# Patient Record
Sex: Male | Born: 1960 | Race: Black or African American | Hispanic: No | Marital: Single
Health system: Southern US, Community
[De-identification: ages and names within clinical notes are randomized; demographics above are authoritative.]

---

## 1999-06-16 ENCOUNTER — Emergency Department (HOSPITAL_COMMUNITY): Admission: EM | Admit: 1999-06-16 | Discharge: 1999-06-16 | Payer: Self-pay

## 1999-06-16 ENCOUNTER — Encounter: Payer: Self-pay | Admitting: Emergency Medicine

## 1999-11-30 ENCOUNTER — Encounter: Payer: Self-pay | Admitting: Emergency Medicine

## 1999-11-30 ENCOUNTER — Emergency Department (HOSPITAL_COMMUNITY): Admission: EM | Admit: 1999-11-30 | Discharge: 1999-11-30 | Payer: Self-pay | Admitting: Emergency Medicine

## 2005-04-22 ENCOUNTER — Emergency Department (HOSPITAL_COMMUNITY): Admission: EM | Admit: 2005-04-22 | Discharge: 2005-04-22 | Payer: Self-pay | Admitting: Emergency Medicine

## 2010-06-13 ENCOUNTER — Emergency Department (HOSPITAL_COMMUNITY): Admission: EM | Admit: 2010-06-13 | Discharge: 2010-06-13 | Payer: Self-pay | Admitting: Emergency Medicine

## 2012-04-11 IMAGING — CR DG HAND COMPLETE 3+V*L*
4 series · 4 of 4 positions shown · non-contrast
Comparison: None.

CLINICAL DATA: Laceration, trauma

LEFT HAND - COMPLETE 3+ VIEW

[x hand pa left]
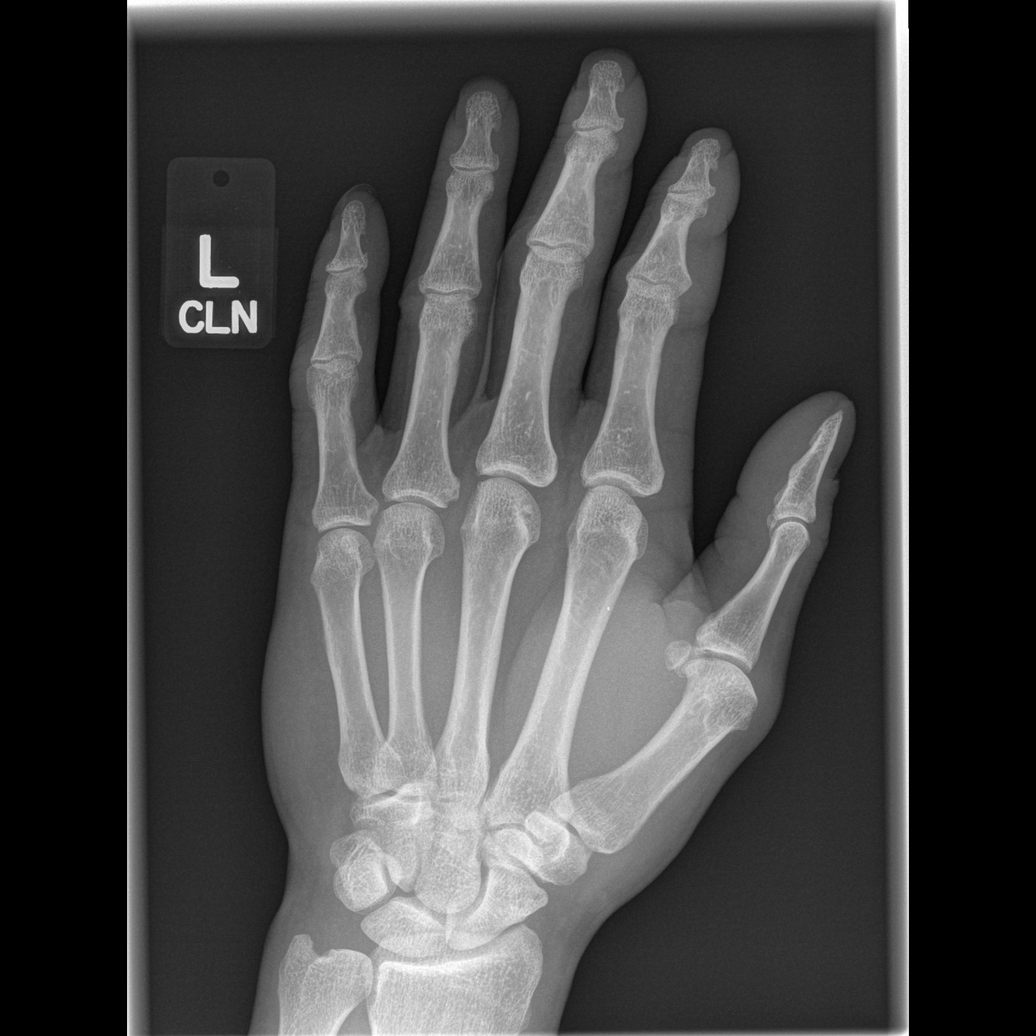

[x hand oblique left]
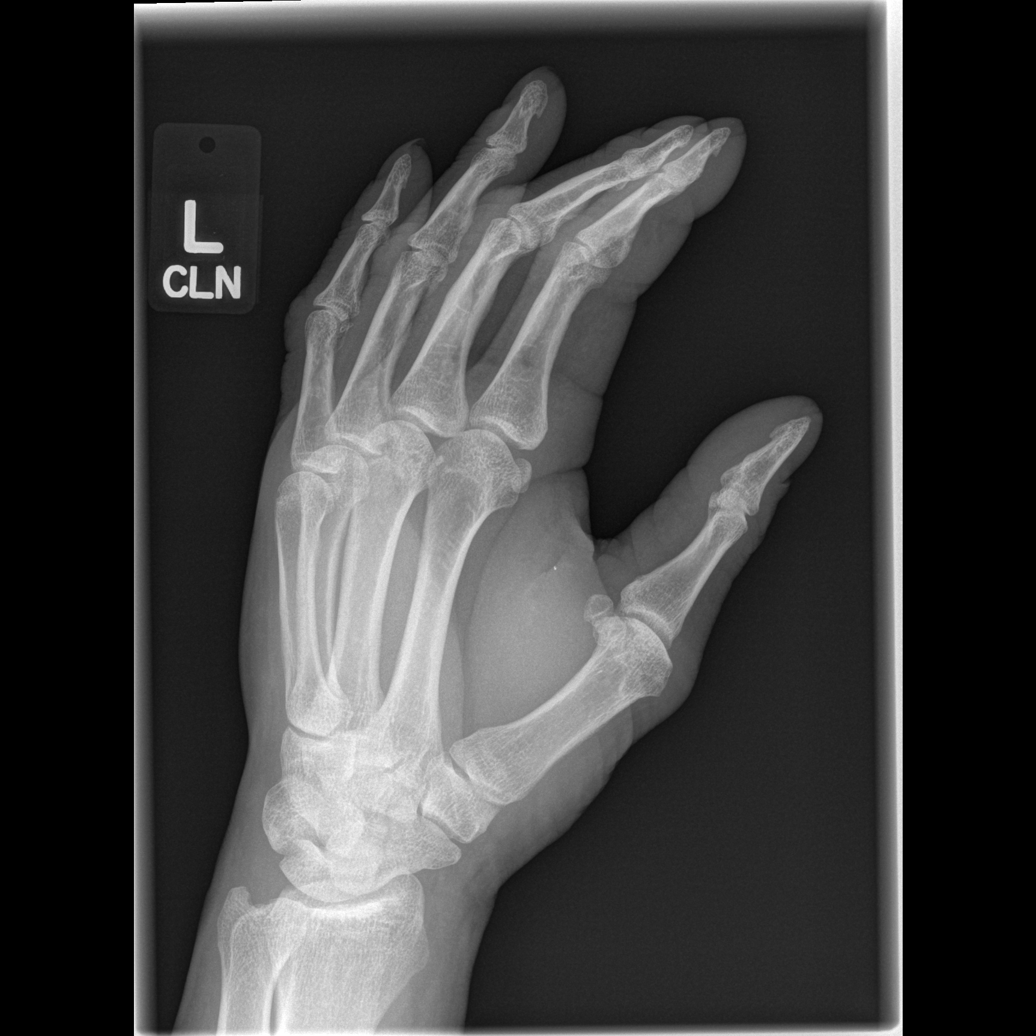

[x hand lat left (1 of 2)]
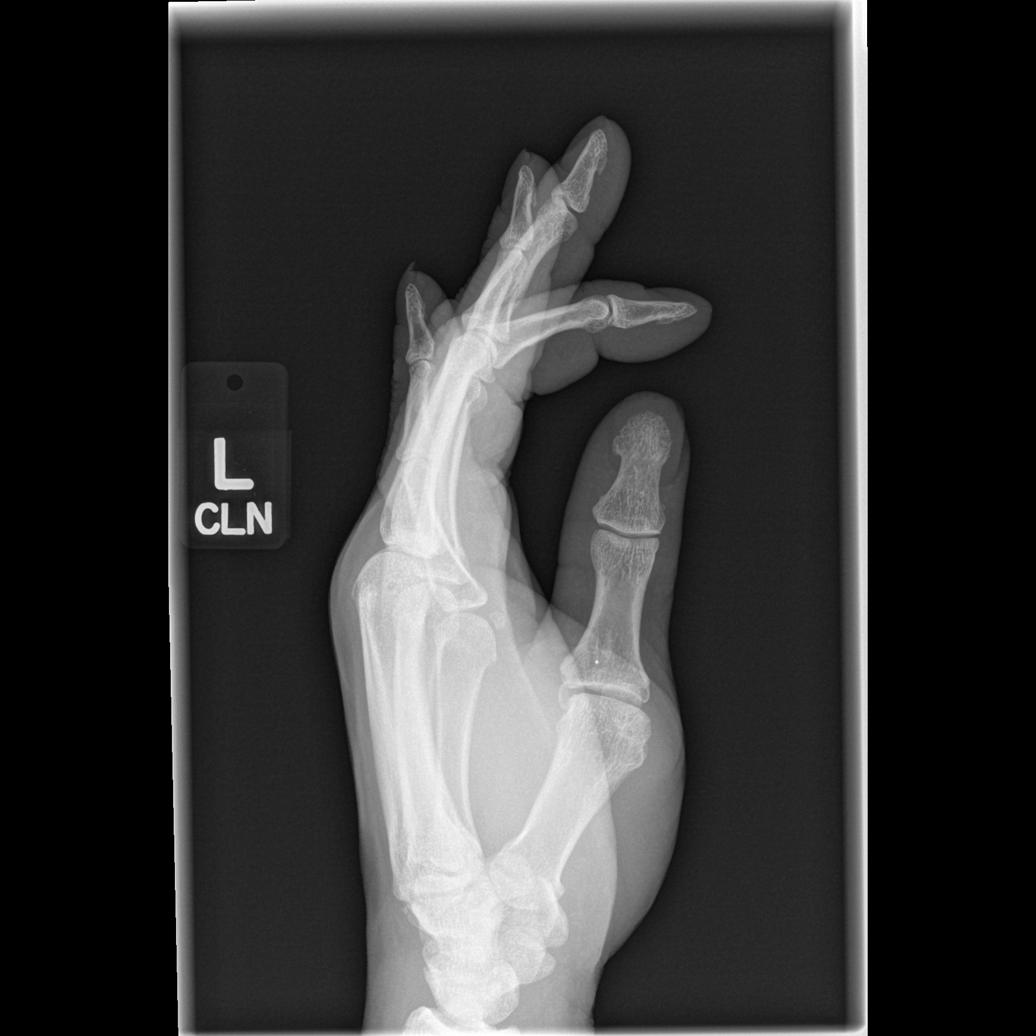

[x hand lat left (2 of 2)]
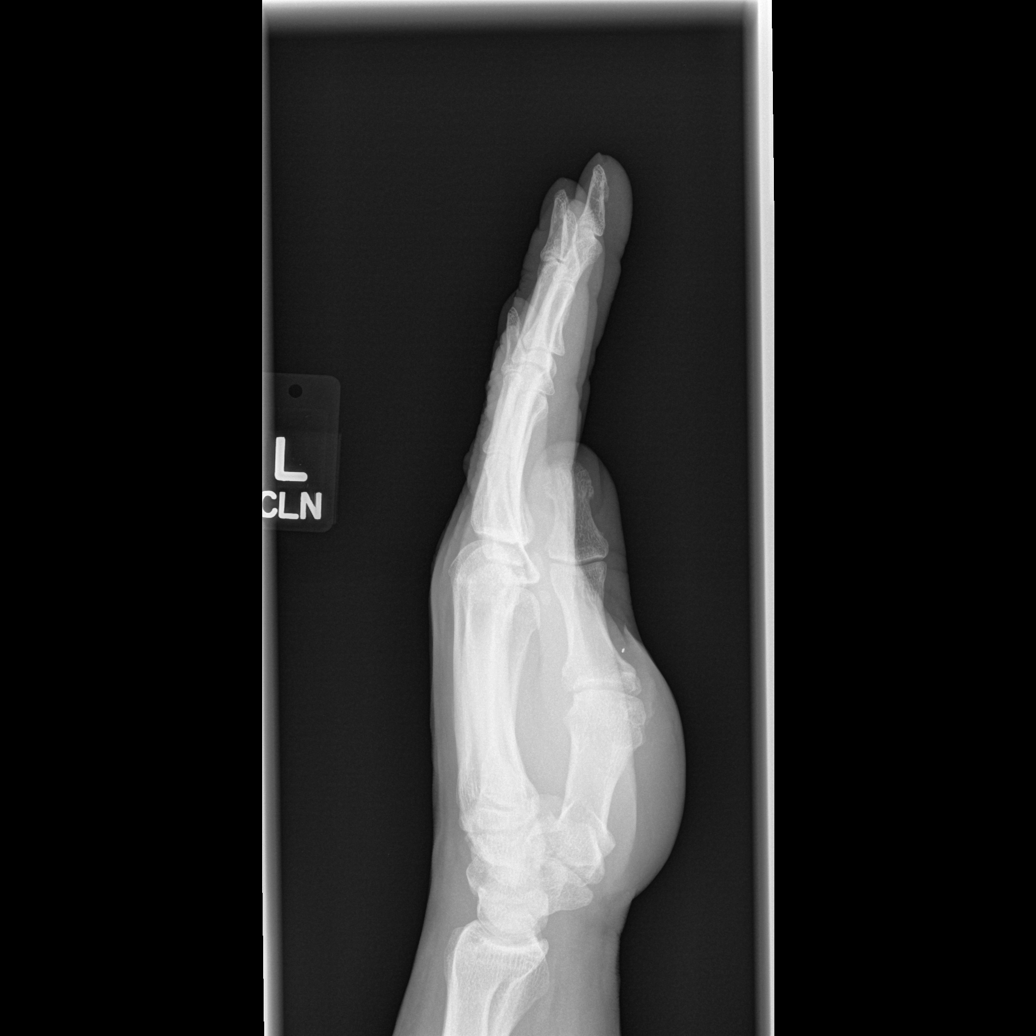

[4 of 4 positions shown; findings below may reference images not displayed]

FINDINGS: Soft tissue injury in the thenar eminence region.
Punctate radiopaque foreign body noted.  Negative for fracture or
malalignment.  No acute osseous finding.
IMPRESSION: Left thenar eminence soft tissue injury.  Small punctate soft
tissue foreign body.

## 2016-11-13 ENCOUNTER — Encounter: Payer: Self-pay | Admitting: Pediatric Intensive Care

## 2016-11-13 NOTE — Congregational Nurse Program (Signed)
Congregational Nurse Program Note  Date of Encounter: 11/12/2016  Past Medical History: No past medical history on file.  Encounter Details:     CNP Questionnaire - 11/12/16 1000      Patient Demographics   Is this a new or existing patient? New   Patient is considered a/an Not Applicable   Race African-American/Black     Patient Assistance   Location of Patient Assistance Not Applicable   Patient's financial/insurance status Low Income;Self-Pay (Uninsured)   Uninsured Patient ([place] [employer]) Yes   Interventions Assisted patient in making appt.   Patient referred to apply for the following financial assistance Not Applicable   Food insecurities addressed Not Applicable   Transportation assistance No   Assistance securing medications No   Educational health offerings Chronic disease;Medications;Navigating the healthcare system     Encounter Details   Primary purpose of visit Chronic Illness/Condition Visit;Navigating the Healthcare System   Was an Emergency Department visit averted? Not Applicable   Does patient have a medical provider? No   Patient referred to Clinic   Was a mental health screening completed? (GAINS tool) No   Does patient have dental issues? No   Does patient have vision issues? No   Does your patient have an abnormal blood pressure today? No   Since previous encounter, have you referred patient for abnormal blood pressure that resulted in a new diagnosis or medication change? No   Does your patient have an abnormal blood glucose today? No   Since previous encounter, have you referred patient for abnormal blood glucose that resulted in a new diagnosis or medication change? No   Was there a life-saving intervention made? No     client requesting assistance with medications.  Has multiple abraided areas on face related to a skin condition.  Client has had medication in the past.  Ref[facility] to [facility] for medication assistance and follow  up

## 2016-12-13 NOTE — Congregational Nurse Program (Signed)
Congregational Nurse Program Note  Date of Encounter: 11/13/2016  Past Medical History: No past medical history on file.  Encounter Details Client new to shelter from incarceration. Has history of hypertension and A-fib. Requests BP check. Cn advised to make appointment at [facility] clinic for follow up as he will require medication when current prescriptions run out.
